# Patient Record
Sex: Male | Born: 1970 | Race: White | Hispanic: No | Marital: Married | Smoking: Never smoker
Health system: Southern US, Community
[De-identification: ages and names within clinical notes are randomized; demographics above are authoritative.]

## PROBLEM LIST (undated history)

## (undated) DIAGNOSIS — M1990 Unspecified osteoarthritis, unspecified site: Secondary | ICD-10-CM

## (undated) DIAGNOSIS — I10 Essential (primary) hypertension: Secondary | ICD-10-CM

## (undated) DIAGNOSIS — E785 Hyperlipidemia, unspecified: Secondary | ICD-10-CM

## (undated) DIAGNOSIS — R112 Nausea with vomiting, unspecified: Secondary | ICD-10-CM

## (undated) DIAGNOSIS — Z9889 Other specified postprocedural states: Secondary | ICD-10-CM

## (undated) HISTORY — DX: Hyperlipidemia, unspecified: E78.5

## (undated) HISTORY — DX: Unspecified osteoarthritis, unspecified site: M19.90

---

## 2004-11-28 HISTORY — PX: LYMPH NODE BIOPSY: SHX201

## 2017-12-11 DIAGNOSIS — Z0000 Encounter for general adult medical examination without abnormal findings: Secondary | ICD-10-CM | POA: Diagnosis not present

## 2017-12-11 DIAGNOSIS — Z6839 Body mass index (BMI) 39.0-39.9, adult: Secondary | ICD-10-CM | POA: Diagnosis not present

## 2017-12-11 DIAGNOSIS — Z13220 Encounter for screening for lipoid disorders: Secondary | ICD-10-CM | POA: Diagnosis not present

## 2018-01-16 DIAGNOSIS — L578 Other skin changes due to chronic exposure to nonionizing radiation: Secondary | ICD-10-CM | POA: Diagnosis not present

## 2018-01-16 DIAGNOSIS — L720 Epidermal cyst: Secondary | ICD-10-CM | POA: Diagnosis not present

## 2018-01-16 DIAGNOSIS — D485 Neoplasm of uncertain behavior of skin: Secondary | ICD-10-CM | POA: Diagnosis not present

## 2018-01-16 DIAGNOSIS — D1801 Hemangioma of skin and subcutaneous tissue: Secondary | ICD-10-CM | POA: Diagnosis not present

## 2018-03-05 DIAGNOSIS — I10 Essential (primary) hypertension: Secondary | ICD-10-CM | POA: Diagnosis not present

## 2018-03-05 DIAGNOSIS — E782 Mixed hyperlipidemia: Secondary | ICD-10-CM | POA: Diagnosis not present

## 2018-03-05 DIAGNOSIS — Z6834 Body mass index (BMI) 34.0-34.9, adult: Secondary | ICD-10-CM | POA: Diagnosis not present

## 2018-04-26 DIAGNOSIS — L720 Epidermal cyst: Secondary | ICD-10-CM | POA: Diagnosis not present

## 2018-12-05 DIAGNOSIS — Z6833 Body mass index (BMI) 33.0-33.9, adult: Secondary | ICD-10-CM | POA: Diagnosis not present

## 2018-12-05 DIAGNOSIS — Z79899 Other long term (current) drug therapy: Secondary | ICD-10-CM | POA: Diagnosis not present

## 2018-12-05 DIAGNOSIS — Z2821 Immunization not carried out because of patient refusal: Secondary | ICD-10-CM | POA: Diagnosis not present

## 2018-12-05 DIAGNOSIS — R899 Unspecified abnormal finding in specimens from other organs, systems and tissues: Secondary | ICD-10-CM | POA: Diagnosis not present

## 2018-12-05 DIAGNOSIS — E782 Mixed hyperlipidemia: Secondary | ICD-10-CM | POA: Diagnosis not present

## 2018-12-05 DIAGNOSIS — I10 Essential (primary) hypertension: Secondary | ICD-10-CM | POA: Diagnosis not present

## 2018-12-05 DIAGNOSIS — Z1331 Encounter for screening for depression: Secondary | ICD-10-CM | POA: Diagnosis not present

## 2019-08-06 DIAGNOSIS — E782 Mixed hyperlipidemia: Secondary | ICD-10-CM | POA: Diagnosis not present

## 2019-08-06 DIAGNOSIS — I10 Essential (primary) hypertension: Secondary | ICD-10-CM | POA: Diagnosis not present

## 2019-08-06 DIAGNOSIS — Z6834 Body mass index (BMI) 34.0-34.9, adult: Secondary | ICD-10-CM | POA: Diagnosis not present

## 2019-08-06 DIAGNOSIS — R899 Unspecified abnormal finding in specimens from other organs, systems and tissues: Secondary | ICD-10-CM | POA: Diagnosis not present

## 2019-08-06 DIAGNOSIS — Z79899 Other long term (current) drug therapy: Secondary | ICD-10-CM | POA: Diagnosis not present

## 2019-08-06 DIAGNOSIS — Z2821 Immunization not carried out because of patient refusal: Secondary | ICD-10-CM | POA: Diagnosis not present

## 2019-11-06 DIAGNOSIS — Z20828 Contact with and (suspected) exposure to other viral communicable diseases: Secondary | ICD-10-CM | POA: Diagnosis not present

## 2020-07-11 ENCOUNTER — Other Ambulatory Visit: Payer: Self-pay

## 2020-07-11 ENCOUNTER — Ambulatory Visit (INDEPENDENT_AMBULATORY_CARE_PROVIDER_SITE_OTHER): Payer: 59

## 2020-07-11 ENCOUNTER — Encounter (HOSPITAL_COMMUNITY): Payer: Self-pay

## 2020-07-11 ENCOUNTER — Ambulatory Visit (HOSPITAL_COMMUNITY)
Admission: EM | Admit: 2020-07-11 | Discharge: 2020-07-11 | Disposition: A | Discharge status: Home / Self Care | Payer: 59 | Attending: Family Medicine | Admitting: Family Medicine

## 2020-07-11 DIAGNOSIS — S52602A Unspecified fracture of lower end of left ulna, initial encounter for closed fracture: Secondary | ICD-10-CM

## 2020-07-11 DIAGNOSIS — S6992XA Unspecified injury of left wrist, hand and finger(s), initial encounter: Secondary | ICD-10-CM

## 2020-07-11 DIAGNOSIS — S52502A Unspecified fracture of the lower end of left radius, initial encounter for closed fracture: Secondary | ICD-10-CM

## 2020-07-11 DIAGNOSIS — M25532 Pain in left wrist: Secondary | ICD-10-CM | POA: Diagnosis not present

## 2020-07-11 HISTORY — DX: Essential (primary) hypertension: I10

## 2020-07-11 NOTE — Progress Notes (Signed)
Orthopedic Tech Progress Note Patient Details:  Robert Monroe 1970-12-25 [id number]  Ortho Devices Type of Ortho Device: Sugartong splint, Sling immobilizer Ortho Device/Splint Location: Left Upper Extremity Ortho Device/Splint Interventions: Ordered, Application, Adjustment   Post Interventions Patient Tolerated: Well Instructions Provided: Adjustment of device, Care of device, Poper ambulation with device   Tia Gelb P Harle Stanford 07/11/2020, 5:47 PM

## 2020-07-11 NOTE — Discharge Instructions (Addendum)
If not allergic, you may use over the counter ibuprofen or acetaminophen as needed. ° °
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
## 2020-07-15 ENCOUNTER — Other Ambulatory Visit: Payer: Self-pay

## 2020-07-15 ENCOUNTER — Encounter (HOSPITAL_BASED_OUTPATIENT_CLINIC_OR_DEPARTMENT_OTHER): Payer: Self-pay | Admitting: Orthopedic Surgery

## 2020-07-16 ENCOUNTER — Other Ambulatory Visit (HOSPITAL_COMMUNITY)
Admission: RE | Admit: 2020-07-16 | Discharge: 2020-07-16 | Disposition: A | Discharge status: Home / Self Care | Payer: 59 | Source: Ambulatory Visit | Attending: Orthopedic Surgery | Admitting: Orthopedic Surgery

## 2020-07-16 ENCOUNTER — Encounter (HOSPITAL_BASED_OUTPATIENT_CLINIC_OR_DEPARTMENT_OTHER)
Admission: RE | Admit: 2020-07-16 | Discharge: 2020-07-16 | Disposition: A | Discharge status: Home / Self Care | Payer: 59 | Source: Ambulatory Visit | Attending: Orthopedic Surgery | Admitting: Orthopedic Surgery

## 2020-07-16 DIAGNOSIS — Z01812 Encounter for preprocedural laboratory examination: Secondary | ICD-10-CM | POA: Insufficient documentation

## 2020-07-16 DIAGNOSIS — I10 Essential (primary) hypertension: Secondary | ICD-10-CM | POA: Diagnosis not present

## 2020-07-16 DIAGNOSIS — Z20822 Contact with and (suspected) exposure to covid-19: Secondary | ICD-10-CM | POA: Insufficient documentation

## 2020-07-16 DIAGNOSIS — S52572A Other intraarticular fracture of lower end of left radius, initial encounter for closed fracture: Secondary | ICD-10-CM | POA: Diagnosis not present

## 2020-07-16 DIAGNOSIS — S52612A Displaced fracture of left ulna styloid process, initial encounter for closed fracture: Secondary | ICD-10-CM | POA: Diagnosis not present

## 2020-07-16 LAB — SARS CORONAVIRUS 2 (TAT 6-24 HRS): SARS Coronavirus 2: NEGATIVE
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
## 2020-07-20 ENCOUNTER — Ambulatory Visit (HOSPITAL_BASED_OUTPATIENT_CLINIC_OR_DEPARTMENT_OTHER)
Admission: RE | Admit: 2020-07-20 | Discharge: 2020-07-20 | Disposition: A | Discharge status: Home / Self Care | Payer: 59 | Attending: Orthopedic Surgery | Admitting: Orthopedic Surgery

## 2020-07-20 ENCOUNTER — Encounter (HOSPITAL_BASED_OUTPATIENT_CLINIC_OR_DEPARTMENT_OTHER): Payer: Self-pay | Admitting: Orthopedic Surgery

## 2020-07-20 ENCOUNTER — Encounter (HOSPITAL_BASED_OUTPATIENT_CLINIC_OR_DEPARTMENT_OTHER)
Admission: RE | Disposition: A | Discharge status: Home / Self Care | Payer: Self-pay | Source: Home / Self Care | Attending: Orthopedic Surgery

## 2020-07-20 ENCOUNTER — Ambulatory Visit (HOSPITAL_BASED_OUTPATIENT_CLINIC_OR_DEPARTMENT_OTHER): Payer: 59 | Admitting: Anesthesiology

## 2020-07-20 ENCOUNTER — Other Ambulatory Visit: Payer: Self-pay

## 2020-07-20 DIAGNOSIS — I10 Essential (primary) hypertension: Secondary | ICD-10-CM | POA: Insufficient documentation

## 2020-07-20 DIAGNOSIS — S52502A Unspecified fracture of the lower end of left radius, initial encounter for closed fracture: Secondary | ICD-10-CM

## 2020-07-20 DIAGNOSIS — S52612A Displaced fracture of left ulna styloid process, initial encounter for closed fracture: Secondary | ICD-10-CM | POA: Insufficient documentation

## 2020-07-20 DIAGNOSIS — S52572A Other intraarticular fracture of lower end of left radius, initial encounter for closed fracture: Secondary | ICD-10-CM | POA: Diagnosis not present

## 2020-07-20 HISTORY — DX: Nausea with vomiting, unspecified: R11.2

## 2020-07-20 HISTORY — PX: ORIF WRIST FRACTURE: SHX2133

## 2020-07-20 HISTORY — DX: Other specified postprocedural states: Z98.890

## 2020-07-20 SURGERY — OPEN REDUCTION INTERNAL FIXATION (ORIF) WRIST FRACTURE
Anesthesia: Regional | Site: Wrist | Laterality: Left

## 2020-07-20 MED ORDER — CEFAZOLIN SODIUM-DEXTROSE 2-4 GM/100ML-% IV SOLN
2.0000 g | INTRAVENOUS | Status: AC
  Administered 2020-07-20: 2 g via INTRAVENOUS

## 2020-07-20 MED ORDER — DEXAMETHASONE SODIUM PHOSPHATE 10 MG/ML IJ SOLN
INTRAMUSCULAR | Status: DC | PRN
  Administered 2020-07-20: 8 mg via INTRAVENOUS

## 2020-07-20 MED ORDER — LACTATED RINGERS IV SOLN: INTRAVENOUS | Status: DC

## 2020-07-20 MED ORDER — PROPOFOL 500 MG/50ML IV EMUL
INTRAVENOUS | Status: DC | PRN
  Administered 2020-07-20: 75 ug/kg/min via INTRAVENOUS

## 2020-07-20 MED ORDER — KETOROLAC TROMETHAMINE 30 MG/ML IJ SOLN
30.0000 mg | Freq: Once | INTRAMUSCULAR | Status: AC | PRN
  Administered 2020-07-20: 30 mg via INTRAVENOUS

## 2020-07-20 MED ORDER — FENTANYL CITRATE (PF) 100 MCG/2ML IJ SOLN
INTRAMUSCULAR | Status: AC
  Filled 2020-07-20: qty 2

## 2020-07-20 MED ORDER — MIDAZOLAM HCL 2 MG/2ML IJ SOLN
2.0000 mg | Freq: Once | INTRAMUSCULAR | Status: AC
  Administered 2020-07-20: 2 mg via INTRAVENOUS

## 2020-07-20 MED ORDER — PROPOFOL 500 MG/50ML IV EMUL
INTRAVENOUS | Status: AC
  Filled 2020-07-20: qty 50

## 2020-07-20 MED ORDER — KETOROLAC TROMETHAMINE 30 MG/ML IJ SOLN
INTRAMUSCULAR | Status: AC
  Filled 2020-07-20: qty 1

## 2020-07-20 MED ORDER — ONDANSETRON HCL 4 MG/2ML IJ SOLN
INTRAMUSCULAR | Status: DC | PRN
  Administered 2020-07-20: 4 mg via INTRAVENOUS

## 2020-07-20 MED ORDER — OXYCODONE HCL 5 MG/5ML PO SOLN: 5.0000 mg | Freq: Once | ORAL | Status: DC | PRN

## 2020-07-20 MED ORDER — ACETAMINOPHEN 500 MG PO TABS
ORAL_TABLET | ORAL | Status: AC
  Filled 2020-07-20: qty 2

## 2020-07-20 MED ORDER — DEXAMETHASONE SODIUM PHOSPHATE 10 MG/ML IJ SOLN
INTRAMUSCULAR | Status: AC
  Filled 2020-07-20: qty 1

## 2020-07-20 MED ORDER — CEFAZOLIN SODIUM-DEXTROSE 2-4 GM/100ML-% IV SOLN
INTRAVENOUS | Status: AC
  Filled 2020-07-20: qty 100

## 2020-07-20 MED ORDER — OXYCODONE HCL 5 MG PO TABS: 5.0000 mg | ORAL_TABLET | Freq: Once | ORAL | Status: DC | PRN

## 2020-07-20 MED ORDER — ONDANSETRON HCL 4 MG/2ML IJ SOLN
INTRAMUSCULAR | Status: AC
  Filled 2020-07-20: qty 2

## 2020-07-20 MED ORDER — PROMETHAZINE HCL 25 MG/ML IJ SOLN: 6.2500 mg | INTRAMUSCULAR | Status: DC | PRN

## 2020-07-20 MED ORDER — ACETAMINOPHEN 500 MG PO TABS
1000.0000 mg | ORAL_TABLET | Freq: Once | ORAL | Status: AC
  Administered 2020-07-20: 1000 mg via ORAL

## 2020-07-20 MED ORDER — HYDROMORPHONE HCL 1 MG/ML IJ SOLN
0.2500 mg | INTRAMUSCULAR | Status: DC | PRN
  Administered 2020-07-20: 0.5 mg via INTRAVENOUS

## 2020-07-20 MED ORDER — MIDAZOLAM HCL 2 MG/2ML IJ SOLN
INTRAMUSCULAR | Status: AC
  Filled 2020-07-20: qty 2

## 2020-07-20 MED ORDER — HYDROMORPHONE HCL 1 MG/ML IJ SOLN
INTRAMUSCULAR | Status: AC
  Filled 2020-07-20: qty 0.5

## 2020-07-20 MED ORDER — ROPIVACAINE HCL 5 MG/ML IJ SOLN
INTRAMUSCULAR | Status: DC | PRN
  Administered 2020-07-20: 30 mL via PERINEURAL

## 2020-07-20 MED ORDER — PROPOFOL 10 MG/ML IV BOLUS
INTRAVENOUS | Status: DC | PRN
  Administered 2020-07-20: 20 mg via INTRAVENOUS
  Administered 2020-07-20: 200 mg via INTRAVENOUS

## 2020-07-20 MED ORDER — FENTANYL CITRATE (PF) 100 MCG/2ML IJ SOLN
INTRAMUSCULAR | Status: DC | PRN
  Administered 2020-07-20 (×2): 50 ug via INTRAVENOUS

## 2020-07-20 SURGICAL SUPPLY — 79 items
BENZOIN TINCTURE PRP APPL 2/3 (GAUZE/BANDAGES/DRESSINGS) ×2 IMPLANT
BIT DRILL 2.2 SS TIBIAL (BIT) ×2 IMPLANT
BLADE SURG 15 STRL LF DISP TIS (BLADE) ×1 IMPLANT
BLADE SURG 15 STRL SS (BLADE) ×1
BNDG COHESIVE 4X5 TAN STRL (GAUZE/BANDAGES/DRESSINGS) ×2 IMPLANT
BNDG CONFORM 3 STRL LF (GAUZE/BANDAGES/DRESSINGS) IMPLANT
BNDG ELASTIC 4X5.8 VLCR STR LF (GAUZE/BANDAGES/DRESSINGS) ×4 IMPLANT
BNDG ESMARK 4X9 LF (GAUZE/BANDAGES/DRESSINGS) ×2 IMPLANT
BNDG GAUZE ELAST 4 BULKY (GAUZE/BANDAGES/DRESSINGS) ×2 IMPLANT
CANISTER SUCT 1200ML W/VALVE (MISCELLANEOUS) ×2 IMPLANT
CORD BIPOLAR FORCEPS 12FT (ELECTRODE) IMPLANT
COVER BACK TABLE 60X90IN (DRAPES) ×2 IMPLANT
COVER WAND RF STERILE (DRAPES) IMPLANT
CUFF TOURN SGL QUICK 18X4 (TOURNIQUET CUFF) ×2 IMPLANT
DECANTER SPIKE VIAL GLASS SM (MISCELLANEOUS) IMPLANT
DRAPE EXTREMITY T 121X128X90 (DISPOSABLE) ×2 IMPLANT
DRAPE OEC MINIVIEW 54X84 (DRAPES) ×2 IMPLANT
ELECT NEEDLE TIP 2.8 STRL (NEEDLE) IMPLANT
ELECT REM PT RETURN 9FT ADLT (ELECTROSURGICAL)
ELECTRODE REM PT RTRN 9FT ADLT (ELECTROSURGICAL) IMPLANT
GAUZE SPONGE 4X4 12PLY STRL (GAUZE/BANDAGES/DRESSINGS) ×2 IMPLANT
GLOVE BIO SURGEON STRL SZ 6.5 (GLOVE) ×2 IMPLANT
GLOVE BIOGEL PI IND STRL 6.5 (GLOVE) ×2 IMPLANT
GLOVE BIOGEL PI IND STRL 8.5 (GLOVE) ×1 IMPLANT
GLOVE BIOGEL PI INDICATOR 6.5 (GLOVE) ×2
GLOVE BIOGEL PI INDICATOR 8.5 (GLOVE) ×1
GLOVE ECLIPSE 6.5 STRL STRAW (GLOVE) ×2 IMPLANT
GLOVE SURG ORTHO 8.0 STRL STRW (GLOVE) ×2 IMPLANT
GOWN STRL REUS W/ TWL XL LVL3 (GOWN DISPOSABLE) ×1 IMPLANT
GOWN STRL REUS W/TWL XL LVL3 (GOWN DISPOSABLE) ×1
K-WIRE 1.6 (WIRE) ×1
K-WIRE FX5X1.6XNS BN SS (WIRE) ×1
KWIRE FX5X1.6XNS BN SS (WIRE) ×1 IMPLANT
NEEDLE HYPO 25X1 1.5 SAFETY (NEEDLE) ×2 IMPLANT
NS IRRIG 1000ML POUR BTL (IV SOLUTION) ×2 IMPLANT
PACK BASIN DAY SURGERY FS (CUSTOM PROCEDURE TRAY) ×2 IMPLANT
PAD CAST 4YDX4 CTTN HI CHSV (CAST SUPPLIES) ×2 IMPLANT
PADDING CAST ABS 4INX4YD NS (CAST SUPPLIES) ×1
PADDING CAST ABS COTTON 4X4 ST (CAST SUPPLIES) ×1 IMPLANT
PADDING CAST COTTON 4X4 STRL (CAST SUPPLIES) ×2
PEG LOCKING SMOOTH 2.2X18 (Peg) ×2 IMPLANT
PEG LOCKING SMOOTH 2.2X20 (Screw) ×8 IMPLANT
PEG LOCKING SMOOTH 2.2X22 (Screw) ×2 IMPLANT
PEG LOCKING SMOOTH 2.2X24 (Peg) ×8 IMPLANT
PEG LOCKING SMOOTH 2.2X26 (Peg) ×2 IMPLANT
PENCIL SMOKE EVACUATOR (MISCELLANEOUS) IMPLANT
PLATE WIDE DVR LEFT (Plate) ×2 IMPLANT
SCREW LOCK 16X2.7X 3 LD TPR (Screw) ×3 IMPLANT
SCREW LOCK 18X2.7X 3 LD TPR (Screw) ×2 IMPLANT
SCREW LOCKING 2.7X16 (Screw) ×3 IMPLANT
SCREW LOCKING 2.7X18 (Screw) ×2 IMPLANT
SCREW NONLOCK 2.7X28MM (Screw) ×2 IMPLANT
SLEEVE SCD COMPRESS KNEE MED (MISCELLANEOUS) ×2 IMPLANT
SLING ARM FOAM STRAP LRG (SOFTGOODS) IMPLANT
SPLINT FIBERGLASS 3X35 (CAST SUPPLIES) ×2 IMPLANT
SPLINT FIBERGLASS 4X30 (CAST SUPPLIES) IMPLANT
STOCKINETTE 4X48 STRL (DRAPES) ×2 IMPLANT
STRIP CLOSURE SKIN 1/2X4 (GAUZE/BANDAGES/DRESSINGS) ×2 IMPLANT
SUCTION FRAZIER HANDLE 10FR (MISCELLANEOUS) ×1
SUCTION TUBE FRAZIER 10FR DISP (MISCELLANEOUS) ×1 IMPLANT
SUT FIBERWIRE 3-0 18 TAPR NDL (SUTURE)
SUT MNCRL AB 3-0 PS2 18 (SUTURE) IMPLANT
SUT MON AB 3-0 SH 27 (SUTURE)
SUT MON AB 3-0 SH27 (SUTURE) IMPLANT
SUT PROLENE 3 0 PS 1 (SUTURE) IMPLANT
SUT PROLENE 4 0 PS 2 18 (SUTURE) IMPLANT
SUT VIC AB 0 CT1 27 (SUTURE)
SUT VIC AB 0 CT1 27XBRD ANBCTR (SUTURE) IMPLANT
SUT VIC AB 2-0 PS2 27 (SUTURE) IMPLANT
SUT VIC AB 2-0 SH 27 (SUTURE)
SUT VIC AB 2-0 SH 27XBRD (SUTURE) IMPLANT
SUT VICRYL 4-0 PS2 18IN ABS (SUTURE) IMPLANT
SUTURE FIBERWR 3-0 18 TAPR NDL (SUTURE) IMPLANT
SYR BULB EAR ULCER 3OZ GRN STR (SYRINGE) ×2 IMPLANT
SYR CONTROL 10ML LL (SYRINGE) ×2 IMPLANT
TOWEL GREEN STERILE FF (TOWEL DISPOSABLE) ×2 IMPLANT
TRAY DSU PREP LF (CUSTOM PROCEDURE TRAY) ×2 IMPLANT
TUBE CONNECTING 20X1/4 (TUBING) ×2 IMPLANT
UNDERPAD 30X36 HEAVY ABSORB (UNDERPADS AND DIAPERS) ×2 IMPLANT
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
## 2020-07-20 NOTE — Anesthesia Procedure Notes (Signed)
Procedure Name: LMA Insertion Date/Time: 07/20/2020 12:30 PM Performed by: Thornell Mule, CRNA Pre-anesthesia Checklist: Patient identified, Emergency Drugs available, Suction available and Patient being monitored Patient Re-evaluated:Patient Re-evaluated prior to induction Oxygen Delivery Method: Circle system utilized Preoxygenation: Pre-oxygenation with 100% oxygen Induction Type: IV induction LMA: LMA inserted LMA Size: 5.0 Number of attempts: 1 Placement Confirmation: positive ETCO2 Tube secured with: Tape Dental Injury: Teeth and Oropharynx as per pre-operative assessment

## 2020-07-20 NOTE — Transfer of Care (Signed)
Immediate Anesthesia Transfer of Care Note  Patient: Robert Monroe  Procedure(s) Performed: OPEN REDUCTION INTERNAL FIXATION (ORIF) WRIST FRACTURE (Left Wrist)  Patient Location: PACU  Anesthesia Type:General and Regional  Level of Consciousness: drowsy, patient cooperative and responds to stimulation  Airway & Oxygen Therapy: Patient Spontanous Breathing and Patient connected to face mask oxygen  Post-op Assessment: Report given to RN and Post -op Vital signs reviewed and stable  Post vital signs: Reviewed and stable  Last Vitals:  Vitals Value Taken Time  BP    Temp    Pulse    Resp    SpO2      Last Pain:  Vitals:   07/20/20 1057  TempSrc: Oral  PainSc: 0-No pain         Complications: No complications documented.

## 2020-07-20 NOTE — Discharge Instructions (Signed)
KEEP BANDAGE CLEAN AND DRY CALL OFFICE FOR F/U APPT [phone] SENT TO [facility] ON [address], PERCOCET, ROBAXIN, COLACE KEEP HAND ELEVATED ABOVE HEART OK TO APPLY ICE TO OPERATIVE AREA CONTACT OFFICE IF ANY WORSENING PAIN OR CONCERNS.  May have Tylenol at 5p.m. May have Ibuprofen at 8 p.m.   Post Anesthesia Home Care Instructions  Activity: Get plenty of rest for the remainder of the day. A responsible individual must stay with you for 24 hours following the procedure.  For the next 24 hours, DO NOT: -Drive a car -Advertising copywriter -Drink alcoholic beverages -Take any medication unless instructed by your physician -Make any legal decisions or sign important papers.  Meals: Start with liquid foods such as gelatin or soup. Progress to regular foods as tolerated. Avoid greasy, spicy, heavy foods. If nausea and/or vomiting occur, drink only clear liquids until the nausea and/or vomiting subsides. Call your physician if vomiting continues.  Special Instructions/Symptoms: Your throat may feel dry or sore from the anesthesia or the breathing tube placed in your throat during surgery. If this causes discomfort, gargle with warm salt water. The discomfort should disappear within 24 hours.  If you had a scopolamine patch placed behind your ear for the management of post- operative nausea and/or vomiting:  1. The medication in the patch is effective for 72 hours, after which it should be removed.  Wrap patch in a tissue and discard in the trash. Wash hands thoroughly with soap and water. 2. You may remove the patch earlier than 72 hours if you experience unpleasant side effects which may include dry mouth, dizziness or visual disturbances. 3. Avoid touching the patch. Wash your hands with soap and water after contact with the patch.    Regional Anesthesia Blocks  1. Numbness or the inability to move the "blocked" extremity may last from 3-48 hours after placement. The length of time  depends on the medication injected and your individual response to the medication. If the numbness is not going away after 48 hours, call your surgeon.  2. The extremity that is blocked will need to be protected until the numbness is gone and the  Strength has returned. Because you cannot feel it, you will need to take extra care to avoid injury. Because it may be weak, you may have difficulty moving it or using it. You may not know what position it is in without looking at it while the block is in effect.  3. For blocks in the legs and feet, returning to weight bearing and walking needs to be done carefully. You will need to wait until the numbness is entirely gone and the strength has returned. You should be able to move your leg and foot normally before you try and bear weight or walk. You will need someone to be with you when you first try to ensure you do not fall and possibly risk injury.  4. Bruising and tenderness at the needle site are common side effects and will resolve in a few days.  5. Persistent numbness or new problems with movement should be communicated to the surgeon or the [facility] [phone] [facility] [phone]).

## 2020-07-20 NOTE — Anesthesia Preprocedure Evaluation (Addendum)
Anesthesia Evaluation  Patient identified by MRN, date of birth, ID band Patient awake    Reviewed: Allergy & Precautions, Patient's Chart, lab work & pertinent test results  History of Anesthesia Complications (+) PONV and history of anesthetic complications  Airway Mallampati: I  TM Distance: >3 FB Neck ROM: Full    Dental  (+) Missing   Pulmonary neg pulmonary ROS,    Pulmonary exam normal breath sounds clear to auscultation       Cardiovascular hypertension, Pt. on home beta blockers Normal cardiovascular exam Rhythm:Regular Rate:Normal  ECG: SB, rate 57   Neuro/Psych negative neurological ROS  negative psych ROS   GI/Hepatic negative GI ROS, Neg liver ROS,   Endo/Other  negative endocrine ROS  Renal/GU negative Renal ROS     Musculoskeletal negative musculoskeletal ROS (+)   Abdominal   Peds  Hematology  (+) REFUSES BLOOD PRODUCTS, HLD   Anesthesia Other Findings Left wrist intra articular distal radius fracture displaced  Reproductive/Obstetrics                            Anesthesia Physical Anesthesia Plan  ASA: II  Anesthesia Plan: Regional   Post-op Pain Management:    Induction: Intravenous  PONV Risk Score and Plan: 2 and Ondansetron, Dexamethasone, Propofol infusion, Midazolam and Treatment may vary due to age or medical condition  Airway Management Planned: Simple Face Mask  Additional Equipment:   Intra-op Plan:   Post-operative Plan:   Informed Consent: I have reviewed the patients History and Physical, chart, labs and discussed the procedure including the risks, benefits and alternatives for the proposed anesthesia with the patient or authorized representative who has indicated his/her understanding and acceptance.     Dental advisory given  Plan Discussed with: CRNA  Anesthesia Plan Comments:        Anesthesia Quick Evaluation

## 2020-07-20 NOTE — Anesthesia Procedure Notes (Signed)
Anesthesia Regional Block: Supraclavicular block   Pre-Anesthetic Checklist: ,, timeout performed, Correct Patient, Correct Site, Correct Laterality, Correct Procedure, Correct Position, site marked, Risks and benefits discussed,  Surgical consent,  Pre-op evaluation,  At surgeon's request and post-op pain management  Laterality: Left  Prep: chloraprep       Needles:  Injection technique: Single-shot  Needle Type: Echogenic Stimulator Needle     Needle Length: 10cm  Needle Gauge: 20     Additional Needles:   Procedures:,,,, ultrasound used (permanent image in chart),,,,  Narrative:  Start time: 07/20/2020 11:40 AM End time: 07/20/2020 11:50 AM Injection made incrementally with aspirations every 5 mL.  Performed by: Personally  Anesthesiologist: Leonides Grills, MD  Additional Notes: Functioning IV was confirmed and monitors were applied.  A timeout was performed. Sterile prep, hand hygiene and sterile gloves were used. A 20ga BBraun echogenic stimulator needle was used. Negative aspiration and negative test dose prior to incremental administration of local anesthetic. The patient tolerated the procedure well.  Ultrasound guidance: relevent anatomy identified, needle position confirmed, local anesthetic spread visualized around nerve(s), vascular puncture avoided.  Image printed for medical record.

## 2020-07-20 NOTE — Anesthesia Postprocedure Evaluation (Signed)
Anesthesia Post Note  Patient: Robert Monroe  Procedure(s) Performed: OPEN REDUCTION INTERNAL FIXATION (ORIF) WRIST FRACTURE (Left Wrist)     Patient location during evaluation: PACU Anesthesia Type: Regional and General Level of consciousness: awake and alert Pain management: pain level controlled Vital Signs Assessment: post-procedure vital signs reviewed and stable Respiratory status: spontaneous breathing, nonlabored ventilation, respiratory function stable and patient connected to nasal cannula oxygen Cardiovascular status: blood pressure returned to baseline and stable Postop Assessment: no apparent nausea or vomiting Anesthetic complications: no   No complications documented.  Last Vitals:  Vitals:   07/20/20 1415 07/20/20 1500  BP: (!) 141/89 (!) 158/90  Pulse: 65 62  Resp: 15 16  Temp:  36.6 C  SpO2: 94% 97%    Last Pain:  Vitals:   07/20/20 1500  TempSrc:   PainSc: 2                  Robert Monroe Robert Monroe

## 2020-07-20 NOTE — Progress Notes (Signed)
Assisted Dr. Ellender with left, ultrasound guided, supraclavicular block. Side rails up, monitors on throughout procedure. See vital signs in flow sheet. Tolerated Procedure well. 

## 2020-07-21 ENCOUNTER — Encounter (HOSPITAL_BASED_OUTPATIENT_CLINIC_OR_DEPARTMENT_OTHER): Payer: Self-pay | Admitting: Orthopedic Surgery

## 2021-06-17 IMAGING — DX DG WRIST COMPLETE 3+V*L*
4 series · 4 of 4 positions shown · non-contrast
Comparison: None.

CLINICAL DATA: Left wrist injury after a fall

EXAM:
LEFT WRIST - COMPLETE 3+ VIEW

[wrist pa]
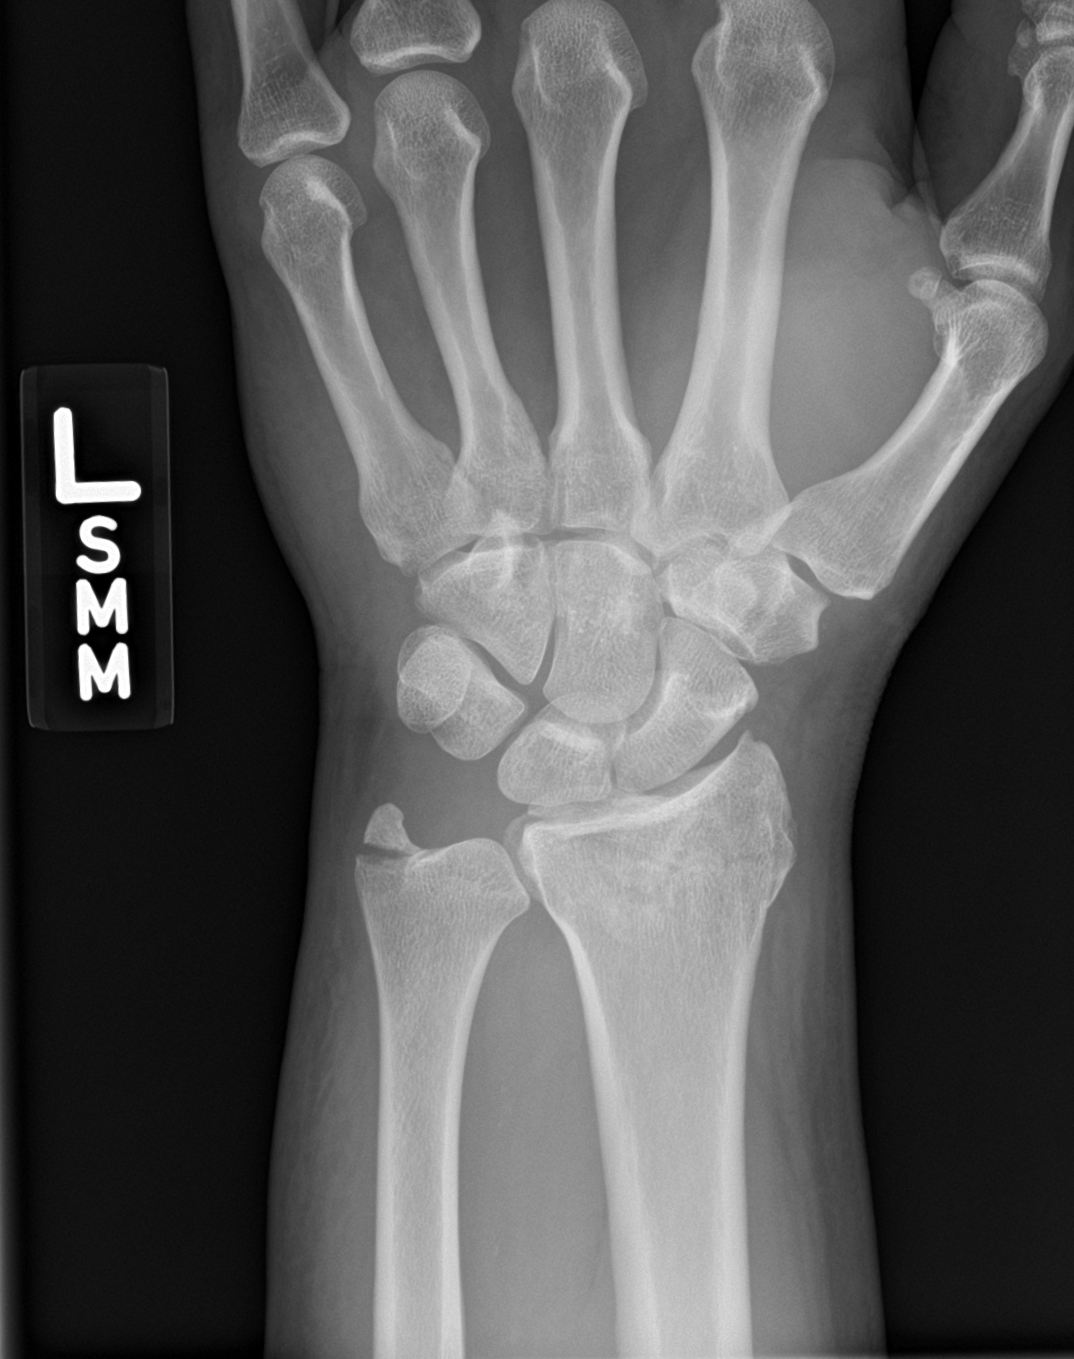

[wrist navicular]
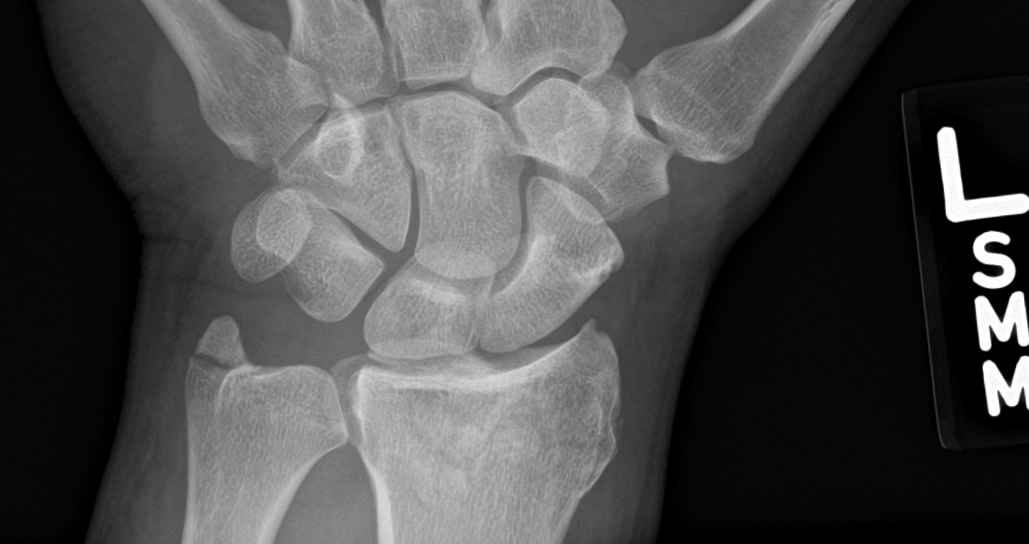

[wrist obl]
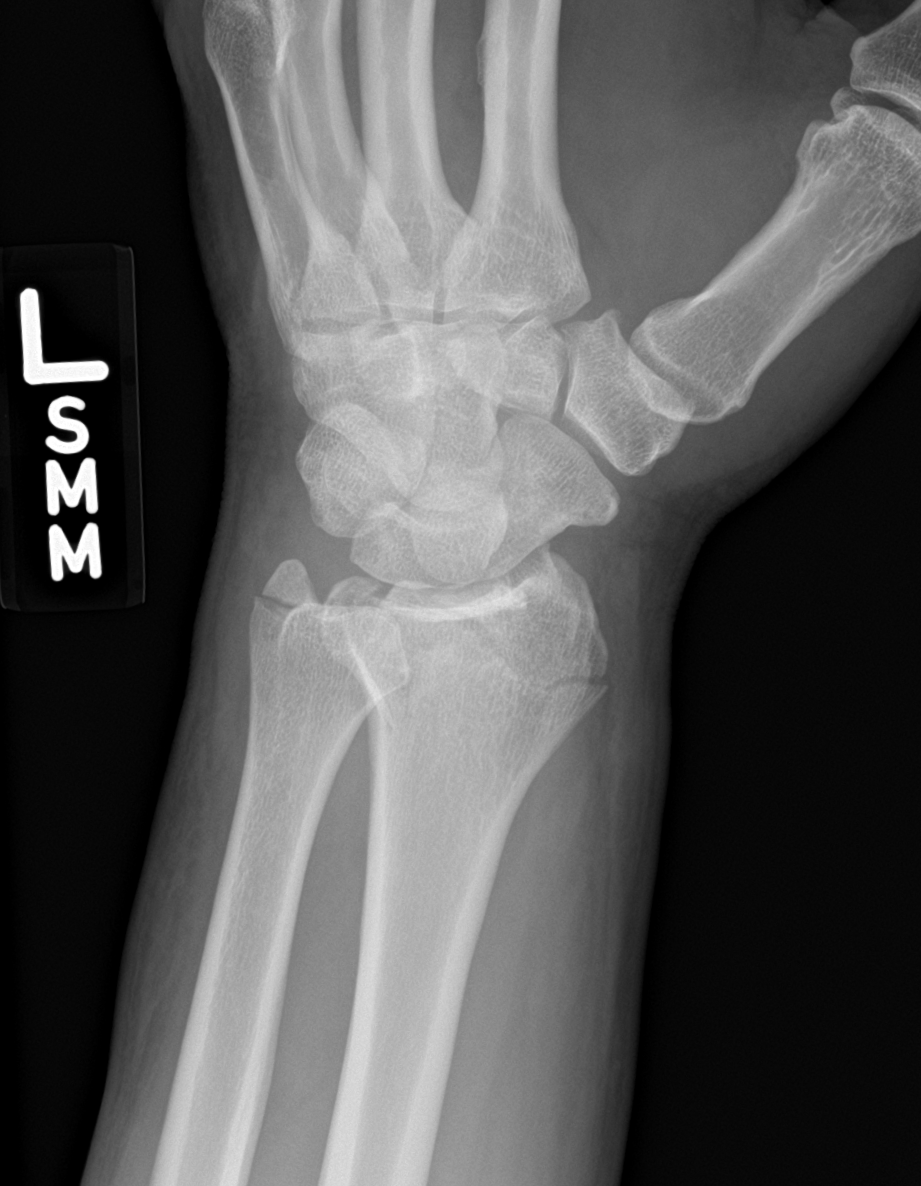

[wrist lat]
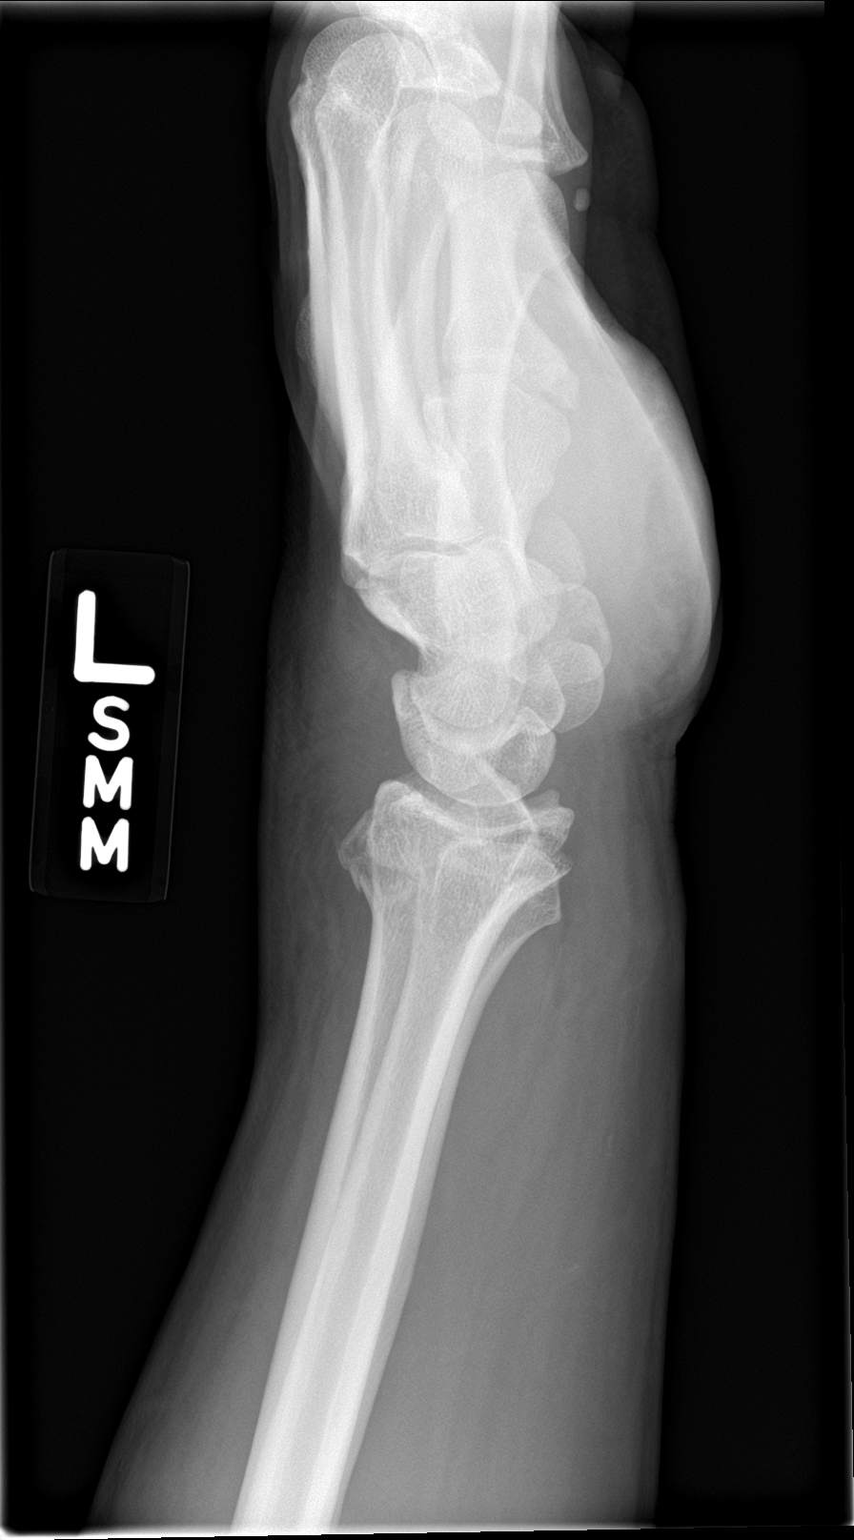

[4 of 4 positions shown; findings below may reference images not displayed]

FINDINGS: Acute, nondisplaced ulnar styloid fracture.

Acute, transverse distal radius fracture that likely reaches the
wrist joint medially (see oblique view). Borderline posterior
impaction of the radius fracture.

Regional soft tissue swelling.
IMPRESSION: Distal radius and ulnar fractures as described.

## 2023-06-29 ENCOUNTER — Encounter: Payer: Self-pay | Admitting: Internal Medicine

## 2023-07-20 ENCOUNTER — Ambulatory Visit (AMBULATORY_SURGERY_CENTER): Payer: Self-pay | Admitting: *Deleted

## 2023-07-20 VITALS — Ht 74.0 in | Wt 235.0 lb

## 2023-07-20 DIAGNOSIS — Z1211 Encounter for screening for malignant neoplasm of colon: Secondary | ICD-10-CM

## 2023-07-20 MED ORDER — NA SULFATE-K SULFATE-MG SULF 17.5-3.13-1.6 GM/177ML PO SOLN: 1.0000 | Freq: Once | ORAL | 0 refills | Status: AC

## 2023-07-20 NOTE — Progress Notes (Signed)
 Pt's name and DOB verified at the beginning of the pre-visit.  Pt denies any difficulty with ambulating,sitting, laying down or rolling side to side Gave both [facility] main # and MD on call # prior to instructions.  No egg or soy allergy known to patient  No issues known to pt with past sedation with any surgeries or procedures Pt denies having issues being intubated Pt has no issues moving head neck or swallowing No FH of Malignant Hyperthermia Pt is not on diet pills Pt is not on home 02  Pt is not on blood thinners  Pt denies issues with constipation  Pt is not on dialysis Pt denise any abnormal heart rhythms  Pt denies any upcoming cardiac testing Pt encouraged to use to use Singlecare or Goodrx to reduce cost  Patient's chart reviewed by Cathlyn Parsons CNRA prior to pre-visit and patient appropriate for the [facility].  Pre-visit completed and red dot placed by patient's name on their procedure day (on provider's schedule).  . Visit by phone Pt states weight is 235 lb Instructed pt why it is important to and  to call if they have any changes in health or new medications. Directed them to the # given and on instructions.   Pt states they will.  Instructions reviewed with pt and pt states understanding. Instructed to review again prior to procedure. Pt states they will.  Instructions sent by mail with coupon and by my chart

## 2023-08-12 ENCOUNTER — Encounter: Payer: Self-pay | Admitting: Certified Registered Nurse Anesthetist

## 2023-08-17 ENCOUNTER — Encounter: Payer: BLUE CROSS/BLUE SHIELD | Admitting: Internal Medicine

## 2024-02-28 ENCOUNTER — Encounter: Payer: Self-pay | Admitting: Internal Medicine

## 2024-04-02 ENCOUNTER — Ambulatory Visit (AMBULATORY_SURGERY_CENTER): Admitting: *Deleted

## 2024-04-02 ENCOUNTER — Telehealth: Payer: Self-pay | Admitting: *Deleted

## 2024-04-02 VITALS — Ht 75.0 in | Wt 243.0 lb

## 2024-04-02 DIAGNOSIS — Z1211 Encounter for screening for malignant neoplasm of colon: Secondary | ICD-10-CM

## 2024-04-02 NOTE — Telephone Encounter (Signed)
Patient reached and PV completed.

## 2024-04-02 NOTE — Progress Notes (Signed)
 PV conducted over telephone. Instructions forwarded through MyChart and secure email.  Patient has suprep from previously cancelled colonoscopy.  Patient has a new rx for [facility].  Instructed to not start or take within 7 days of colonoscopy.    No egg or soy allergy known to patient  No issues known to pt with past sedation with any surgeries or procedures Patient denies ever being told they had issues or difficulty with intubation  No FH of Malignant Hyperthermia Pt is not on diet pills Pt is not on  home 02  Pt is not on blood thinners  Pt denies issues with constipation  No A fib or A flutter Have any cardiac testing pending--NO Pt instructed to use [email or web address] or GoodRx for a price reduction on prep

## 2024-04-05 ENCOUNTER — Encounter: Payer: Self-pay | Admitting: Internal Medicine

## 2024-04-23 ENCOUNTER — Encounter: Payer: Self-pay | Admitting: Internal Medicine

## 2024-04-23 ENCOUNTER — Ambulatory Visit: Admitting: Internal Medicine

## 2024-04-23 VITALS — BP 106/73 | HR 71 | Temp 98.2°F | Resp 10 | Ht 76.0 in | Wt 243.0 lb

## 2024-04-23 DIAGNOSIS — K648 Other hemorrhoids: Secondary | ICD-10-CM | POA: Diagnosis not present

## 2024-04-23 DIAGNOSIS — K635 Polyp of colon: Secondary | ICD-10-CM

## 2024-04-23 DIAGNOSIS — D120 Benign neoplasm of cecum: Secondary | ICD-10-CM

## 2024-04-23 DIAGNOSIS — Z1211 Encounter for screening for malignant neoplasm of colon: Secondary | ICD-10-CM | POA: Diagnosis present

## 2024-04-23 MED ORDER — SODIUM CHLORIDE 0.9 % IV SOLN: 500.0000 mL | INTRAVENOUS | Status: DC

## 2024-04-23 NOTE — Op Note (Signed)
 [facility] Patient Name: Robert Monroe Procedure Date: 04/23/2024 11:07 AM MRN: [id number] Endoscopist: Freada Jacobs [place] , , [id number] Age: 53 Referring MD:  Date of Birth: December 10, 1970 Gender: Male Account #: [id number] Procedure:                Colonoscopy Indications:              Screening for colorectal malignant neoplasm, This                            is the patient's first colonoscopy Medicines:                Monitored Anesthesia Care Procedure:                Pre-Anesthesia Assessment:                           - Prior to the procedure, a History and Physical                            was performed, and patient medications and                            allergies were reviewed. The patient's tolerance of                            previous anesthesia was also reviewed. The risks                            and benefits of the procedure and the sedation                            options and risks were discussed with the patient.                            All questions were answered, and informed consent                            was obtained. Prior Anticoagulants: The patient has                            taken no anticoagulant or antiplatelet agents. ASA                            Grade Assessment: II - A patient with mild systemic                            disease. After reviewing the risks and benefits,                            the patient was deemed in satisfactory condition to                            undergo the procedure.  After obtaining informed consent, the colonoscope                            was passed under direct vision. Throughout the                            procedure, the patient's blood pressure, pulse, and                            oxygen saturations were monitored continuously. The                            Olympus Scope [id number] was introduced through the                            anus and  advanced to the the terminal ileum. The                            colonoscopy was performed without difficulty. The                            patient tolerated the procedure well. The quality                            of the bowel preparation was excellent. The                            terminal ileum, ileocecal valve, appendiceal                            orifice, and rectum were photographed. Scope In: 11:20:07 AM Scope Out: 11:40:46 AM Scope Withdrawal Time: 0 hours 16 minutes 33 seconds  Total Procedure Duration: 0 hours 20 minutes 39 seconds  Findings:                 The terminal ileum appeared normal.                           A 3 mm polyp was found in the cecum. The polyp was                            sessile. The polyp was removed with a cold snare.                            Resection and retrieval were complete.                           Non-bleeding internal hemorrhoids were found during                            retroflexion. Complications:            No immediate complications. Estimated Blood Loss:     Estimated blood loss was minimal. Impression:               -  The examined portion of the ileum was normal.                           - One 3 mm polyp in the cecum, removed with a cold                            snare. Resected and retrieved.                           - Non-bleeding internal hemorrhoids. Recommendation:           - Discharge patient to home (with escort).                           - Await pathology results.                           - The findings and recommendations were discussed                            with the patient. Dr Pedro Bourgeois "[name]" [name],  04/23/2024 11:42:39 AM

## 2024-04-23 NOTE — Progress Notes (Signed)
 Called to room to assist during endoscopic procedure.  Patient ID and intended procedure confirmed with present staff. Received instructions for my participation in the procedure from the performing physician.

## 2024-04-23 NOTE — Progress Notes (Signed)
 To pacu, VSS. Report to [email or web address].tb

## 2024-04-23 NOTE — Progress Notes (Signed)
 Pt's states no medical or surgical changes since previsit or office visit.

## 2024-04-23 NOTE — Progress Notes (Signed)
 GASTROENTEROLOGY PROCEDURE H&P NOTE   Primary Care Physician: Patient, No Pcp Per    Reason for Procedure:   Colon cancer screening  Plan:    Colonoscopy  Patient is appropriate for endoscopic procedure(s) in the ambulatory ([facility]) setting.  The nature of the procedure, as well as the risks, benefits, and alternatives were carefully and thoroughly reviewed with the patient. Ample time for discussion and questions allowed. The patient understood, was satisfied, and agreed to proceed.     HPI: Robert Monroe is a 53 y.o. male who presents for colonoscopy for colon cancer screening. Denies blood in stools, changes in bowel habits, or unintentional weight loss. Denies family history of colon cancer. This is his first colonoscopy.  Past Medical History:  Diagnosis Date   Arthritis    Hyperlipidemia    Hypertension    PONV (postoperative nausea and vomiting)     Past Surgical History:  Procedure Laterality Date   LYMPH NODE BIOPSY Right 2006   ORIF WRIST FRACTURE Left 07/20/2020   Procedure: OPEN REDUCTION INTERNAL FIXATION (ORIF) WRIST FRACTURE;  Surgeon: Arvil Birks, MD;  Location: [facility];  Service: Orthopedics;  Laterality: Left;  IV sedation block in preop    Prior to Admission medications   Medication Sig Start Date End Date Taking? Authorizing Provider  atenolol (TENORMIN) 100 MG tablet Take 100 mg by mouth daily. 05/15/20  Yes [provider]  cetirizine (KLS ALLER-TEC) 10 MG tablet Take 1 tablet every day by oral route.   Yes [provider]  ELDERBERRY PO Take by mouth.   Yes [provider]  escitalopram (LEXAPRO) 10 MG tablet Take 1 tablet by mouth daily. 02/19/24  Yes [provider]  ibuprofen (ADVIL) 600 MG tablet TAKE 1 TABLET(S) BY MOUTH EVERY 4 TO 6 HOURS   Yes [provider]  Magnesium-Zinc (MAGNESIUM-CHELATED ZINC PO) Take 2 capsules in the evening 06/20/23  Yes [provider]   Multiple Vitamins-Minerals (ONE-A-DAY MENS 50+ PO)    Yes [provider]  rosuvastatin (CRESTOR) 10 MG tablet Take 10 mg by mouth at bedtime. 05/15/20  Yes [provider]  tadalafil (CIALIS) 10 MG tablet Take 1 tablet by mouth daily. 07/12/23  Yes [provider]  vitamin B-12 (CYANOCOBALAMIN ) 100 MCG tablet Take 100 mcg by mouth daily.   Yes [provider]  Semaglutide-Weight Management (WEGOVY) 0.25 MG/0.5ML SOAJ Inject 0.25 mg every week by subcutaneous route. Patient not taking: Reported on 04/23/2024 01/31/24   [provider]  triamcinolone cream (KENALOG) 0.5 % APPLY A THIN LAYER TO THE AFFECTED AREA(S) BY TOPICAL ROUTE 2 TIMES PER DAY 01/31/24   [provider]    Current Outpatient Medications  Medication Sig Dispense Refill   atenolol (TENORMIN) 100 MG tablet Take 100 mg by mouth daily.     cetirizine (KLS ALLER-TEC) 10 MG tablet Take 1 tablet every day by oral route.     ELDERBERRY PO Take by mouth.     escitalopram (LEXAPRO) 10 MG tablet Take 1 tablet by mouth daily.     ibuprofen (ADVIL) 600 MG tablet TAKE 1 TABLET(S) BY MOUTH EVERY 4 TO 6 HOURS     Magnesium-Zinc (MAGNESIUM-CHELATED ZINC PO) Take 2 capsules in the evening     Multiple Vitamins-Minerals (ONE-A-DAY MENS 50+ PO)      rosuvastatin (CRESTOR) 10 MG tablet Take 10 mg by mouth at bedtime.     tadalafil (CIALIS) 10 MG tablet Take 1 tablet by mouth  daily.     vitamin B-12 (CYANOCOBALAMIN ) 100 MCG tablet Take 100 mcg by mouth daily.     Semaglutide-Weight Management (WEGOVY) 0.25 MG/0.5ML SOAJ Inject 0.25 mg every week by subcutaneous route. (Patient not taking: Reported on 04/23/2024)     triamcinolone cream (KENALOG) 0.5 % APPLY A THIN LAYER TO THE AFFECTED AREA(S) BY TOPICAL ROUTE 2 TIMES PER DAY     Current Facility-Administered Medications  Medication Dose Route Frequency Provider Last Rate Last Admin   0.9 %  sodium chloride infusion  500 mL Intravenous Continuous  Daina Drum, MD        Allergies as of 04/23/2024   (No Known Allergies)    Family History  Problem Relation Age of Onset   Colon cancer Neg Hx    Colon polyps Neg Hx    Esophageal cancer Neg Hx    Rectal cancer Neg Hx    Stomach cancer Neg Hx     Social History   Socioeconomic History   Marital status: Married    Spouse name: Not on file   Number of children: Not on file   Years of education: Not on file   Highest education level: Not on file  Occupational History   Not on file  Tobacco Use   Smoking status: Never   Smokeless tobacco: Never  Vaping Use   Vaping status: Never Used  Substance and Sexual Activity   Alcohol use: Yes    Comment: occassionally   Drug use: Never   Sexual activity: Not on file  Other Topics Concern   Not on file  Social History Narrative   Not on file   Social Drivers of Health   Financial Resource Strain: Not on file  Food Insecurity: Not on file  Transportation Needs: Not on file  Physical Activity: Not on file  Stress: Not on file  Social Connections: Not on file  Intimate Partner Violence: Not on file    Physical Exam: Vital signs in last 24 hours: BP (!) 140/98   Pulse 71   Temp 98.2 F (36.8 C) (Temporal)   Ht 6\' 4"  (1.93 m)   Wt 243 lb (110.2 kg)   SpO2 95%   BMI 29.58 kg/m  GEN: NAD EYE: Sclerae anicteric ENT: MMM CV: Non-tachycardic Pulm: No increased work of breathing GI: Soft, NT/ND NEURO:  Alert & Oriented   Regino Caprio, MD [facility]  04/23/2024 11:10 AM

## 2024-04-23 NOTE — Patient Instructions (Signed)

## 2024-04-24 ENCOUNTER — Telehealth: Payer: Self-pay | Admitting: *Deleted

## 2024-04-24 NOTE — Telephone Encounter (Signed)
  Follow up Call-     04/23/2024   10:08 AM  Call back number  Post procedure Call Back phone  # [phone]  Permission to leave phone message Yes     Patient questions:  Do you have a fever, pain , or abdominal swelling? No. Pain Score  0 *  Have you tolerated food without any problems? Yes.    Have you been able to return to your normal activities? Yes.    Do you have any questions about your discharge instructions: Diet   No. Medications  No. Follow up visit  No.  Do you have questions or concerns about your Care? No.  Actions: * If pain score is 4 or above: No action needed, pain <4.

## 2024-04-26 ENCOUNTER — Ambulatory Visit: Payer: Self-pay | Admitting: Internal Medicine

## 2024-04-26 LAB — SURGICAL PATHOLOGY

## 2024-07-17 ENCOUNTER — Other Ambulatory Visit: Payer: Self-pay | Admitting: Orthopedic Surgery

## 2024-07-23 ENCOUNTER — Encounter (HOSPITAL_BASED_OUTPATIENT_CLINIC_OR_DEPARTMENT_OTHER): Payer: Self-pay | Admitting: Orthopedic Surgery

## 2024-07-25 ENCOUNTER — Encounter (HOSPITAL_BASED_OUTPATIENT_CLINIC_OR_DEPARTMENT_OTHER)
Admission: RE | Admit: 2024-07-25 | Discharge: 2024-07-25 | Disposition: A | Discharge status: Home / Self Care | Source: Ambulatory Visit | Attending: Orthopedic Surgery | Admitting: Orthopedic Surgery

## 2024-07-25 DIAGNOSIS — Z01810 Encounter for preprocedural cardiovascular examination: Secondary | ICD-10-CM | POA: Diagnosis not present

## 2024-07-25 DIAGNOSIS — Z01818 Encounter for other preprocedural examination: Secondary | ICD-10-CM | POA: Diagnosis present

## 2024-07-25 NOTE — Progress Notes (Signed)

## 2024-07-30 ENCOUNTER — Ambulatory Visit (HOSPITAL_BASED_OUTPATIENT_CLINIC_OR_DEPARTMENT_OTHER)
Admission: RE | Admit: 2024-07-30 | Discharge: 2024-07-30 | Disposition: A | Discharge status: Home / Self Care | Attending: Orthopedic Surgery | Admitting: Orthopedic Surgery

## 2024-07-30 ENCOUNTER — Ambulatory Visit (HOSPITAL_BASED_OUTPATIENT_CLINIC_OR_DEPARTMENT_OTHER): Admitting: Anesthesiology

## 2024-07-30 ENCOUNTER — Encounter (HOSPITAL_BASED_OUTPATIENT_CLINIC_OR_DEPARTMENT_OTHER)
Admission: RE | Disposition: A | Discharge status: Home / Self Care | Payer: Self-pay | Source: Home / Self Care | Attending: Orthopedic Surgery

## 2024-07-30 ENCOUNTER — Encounter (HOSPITAL_BASED_OUTPATIENT_CLINIC_OR_DEPARTMENT_OTHER): Payer: Self-pay | Admitting: Orthopedic Surgery

## 2024-07-30 ENCOUNTER — Other Ambulatory Visit: Payer: Self-pay

## 2024-07-30 DIAGNOSIS — M1990 Unspecified osteoarthritis, unspecified site: Secondary | ICD-10-CM | POA: Diagnosis not present

## 2024-07-30 DIAGNOSIS — S83231A Complex tear of medial meniscus, current injury, right knee, initial encounter: Secondary | ICD-10-CM | POA: Diagnosis present

## 2024-07-30 DIAGNOSIS — E785 Hyperlipidemia, unspecified: Secondary | ICD-10-CM | POA: Insufficient documentation

## 2024-07-30 DIAGNOSIS — Z79899 Other long term (current) drug therapy: Secondary | ICD-10-CM | POA: Diagnosis not present

## 2024-07-30 DIAGNOSIS — I10 Essential (primary) hypertension: Secondary | ICD-10-CM | POA: Insufficient documentation

## 2024-07-30 DIAGNOSIS — X58XXXA Exposure to other specified factors, initial encounter: Secondary | ICD-10-CM | POA: Diagnosis not present

## 2024-07-30 DIAGNOSIS — M65961 Unspecified synovitis and tenosynovitis, right lower leg: Secondary | ICD-10-CM | POA: Insufficient documentation

## 2024-07-30 DIAGNOSIS — Z01818 Encounter for other preprocedural examination: Secondary | ICD-10-CM

## 2024-07-30 HISTORY — PX: KNEE ARTHROSCOPY WITH MEDIAL MENISECTOMY: SHX5651

## 2024-07-30 LAB — NO BLOOD PRODUCTS

## 2024-07-30 SURGERY — ARTHROSCOPY, KNEE, WITH MEDIAL MENISCECTOMY
Anesthesia: General | Site: Knee | Laterality: Right

## 2024-07-30 MED ORDER — LACTATED RINGERS IV SOLN: INTRAVENOUS | Status: DC

## 2024-07-30 MED ORDER — KETOROLAC TROMETHAMINE 30 MG/ML IJ SOLN
INTRAMUSCULAR | Status: DC | PRN
  Administered 2024-07-30: 30 mg via INTRAVENOUS

## 2024-07-30 MED ORDER — PROPOFOL 10 MG/ML IV BOLUS
INTRAVENOUS | Status: DC | PRN
  Administered 2024-07-30: 30 mg via INTRAVENOUS
  Administered 2024-07-30: 50 mg via INTRAVENOUS
  Administered 2024-07-30: 200 mg via INTRAVENOUS

## 2024-07-30 MED ORDER — BUPIVACAINE HCL (PF) 0.5 % IJ SOLN
INTRAMUSCULAR | Status: DC | PRN
  Administered 2024-07-30: 20 mL via INTRA_ARTICULAR

## 2024-07-30 MED ORDER — MIDAZOLAM HCL 5 MG/5ML IJ SOLN
INTRAMUSCULAR | Status: DC | PRN
  Administered 2024-07-30: 2 mg via INTRAVENOUS

## 2024-07-30 MED ORDER — ONDANSETRON HCL 4 MG/2ML IJ SOLN
INTRAMUSCULAR | Status: AC
  Filled 2024-07-30: qty 2

## 2024-07-30 MED ORDER — CEFAZOLIN SODIUM-DEXTROSE 2-4 GM/100ML-% IV SOLN
2.0000 g | INTRAVENOUS | Status: AC
  Administered 2024-07-30: 2 g via INTRAVENOUS

## 2024-07-30 MED ORDER — CEFAZOLIN SODIUM-DEXTROSE 2-4 GM/100ML-% IV SOLN
INTRAVENOUS | Status: AC
  Filled 2024-07-30: qty 100

## 2024-07-30 MED ORDER — ACETAMINOPHEN 500 MG PO TABS
ORAL_TABLET | ORAL | Status: AC
Start: 2024-07-30 — End: 2024-07-30
  Filled 2024-07-30: qty 2

## 2024-07-30 MED ORDER — HYDROMORPHONE HCL 1 MG/ML IJ SOLN
INTRAMUSCULAR | Status: AC
Start: 2024-07-30 — End: 2024-07-30
  Filled 2024-07-30: qty 0.5

## 2024-07-30 MED ORDER — DEXAMETHASONE SODIUM PHOSPHATE 10 MG/ML IJ SOLN
INTRAMUSCULAR | Status: DC | PRN
  Administered 2024-07-30: 10 mg via INTRAVENOUS

## 2024-07-30 MED ORDER — SODIUM CHLORIDE 0.9 % IR SOLN
Status: DC | PRN
Start: 2024-07-30 — End: 2024-07-30
  Administered 2024-07-30: 6000 mL

## 2024-07-30 MED ORDER — PROPOFOL 500 MG/50ML IV EMUL
INTRAVENOUS | Status: AC
  Filled 2024-07-30: qty 50

## 2024-07-30 MED ORDER — LIDOCAINE 2% (20 MG/ML) 5 ML SYRINGE
INTRAMUSCULAR | Status: AC
  Filled 2024-07-30: qty 5

## 2024-07-30 MED ORDER — BUPIVACAINE HCL (PF) 0.25 % IJ SOLN
INTRAMUSCULAR | Status: AC
  Filled 2024-07-30: qty 30

## 2024-07-30 MED ORDER — FENTANYL CITRATE (PF) 100 MCG/2ML IJ SOLN
INTRAMUSCULAR | Status: AC
  Filled 2024-07-30: qty 2

## 2024-07-30 MED ORDER — ONDANSETRON 4 MG PO TBDP: 4.0000 mg | ORAL_TABLET | Freq: Three times a day (TID) | ORAL | 0 refills | Status: AC | PRN

## 2024-07-30 MED ORDER — FENTANYL CITRATE (PF) 100 MCG/2ML IJ SOLN
INTRAMUSCULAR | Status: DC | PRN
  Administered 2024-07-30 (×3): 50 ug via INTRAVENOUS

## 2024-07-30 MED ORDER — OXYCODONE HCL 5 MG PO TABS: 5.0000 mg | ORAL_TABLET | Freq: Four times a day (QID) | ORAL | 0 refills | Status: AC | PRN

## 2024-07-30 MED ORDER — ASPIRIN 81 MG PO TBEC: 81.0000 mg | DELAYED_RELEASE_TABLET | Freq: Two times a day (BID) | ORAL | 0 refills | Status: AC

## 2024-07-30 MED ORDER — HYDROMORPHONE HCL 1 MG/ML IJ SOLN
0.2500 mg | INTRAMUSCULAR | Status: DC | PRN
  Administered 2024-07-30: 0.5 mg via INTRAVENOUS

## 2024-07-30 MED ORDER — DEXAMETHASONE SODIUM PHOSPHATE 10 MG/ML IJ SOLN
INTRAMUSCULAR | Status: AC
Start: 2024-07-30 — End: 2024-07-30
  Filled 2024-07-30: qty 1

## 2024-07-30 MED ORDER — BUPIVACAINE HCL (PF) 0.5 % IJ SOLN
INTRAMUSCULAR | Status: AC
Start: 2024-07-30 — End: 2024-07-30
  Filled 2024-07-30: qty 30

## 2024-07-30 MED ORDER — LIDOCAINE 2% (20 MG/ML) 5 ML SYRINGE
INTRAMUSCULAR | Status: DC | PRN
  Administered 2024-07-30: 60 mg via INTRAVENOUS

## 2024-07-30 MED ORDER — ACETAMINOPHEN 500 MG PO TABS
1000.0000 mg | ORAL_TABLET | Freq: Once | ORAL | Status: AC
  Administered 2024-07-30: 1000 mg via ORAL

## 2024-07-30 MED ORDER — ONDANSETRON HCL 4 MG/2ML IJ SOLN
INTRAMUSCULAR | Status: DC | PRN
  Administered 2024-07-30: 4 mg via INTRAVENOUS

## 2024-07-30 MED ORDER — PROPOFOL 500 MG/50ML IV EMUL
INTRAVENOUS | Status: DC | PRN
Start: 2024-07-30 — End: 2024-07-30
  Administered 2024-07-30: 200 ug/kg/min via INTRAVENOUS

## 2024-07-30 MED ORDER — MIDAZOLAM HCL 2 MG/2ML IJ SOLN
INTRAMUSCULAR | Status: AC
  Filled 2024-07-30: qty 2

## 2024-07-30 SURGICAL SUPPLY — 37 items
BNDG COHESIVE 4X5 TAN STRL LF (GAUZE/BANDAGES/DRESSINGS) IMPLANT
BNDG COMPR ESMARK 6X3 LF (GAUZE/BANDAGES/DRESSINGS) ×1 IMPLANT
BNDG ELASTIC 6INX 5YD STR LF (GAUZE/BANDAGES/DRESSINGS) ×1 IMPLANT
CHLORAPREP W/TINT 26 (MISCELLANEOUS) ×1 IMPLANT
CLSR STERI-STRIP ANTIMIC 1/2X4 (GAUZE/BANDAGES/DRESSINGS) IMPLANT
CUFF TRNQT CYL 34X4.125X (TOURNIQUET CUFF) IMPLANT
DISSECTOR 4.0MM X 13CM (MISCELLANEOUS) ×1 IMPLANT
DRAPE IMP U-DRAPE 54X76 (DRAPES) IMPLANT
DRAPE U-SHAPE 47X51 STRL (DRAPES) ×1 IMPLANT
DRAPE-T ARTHROSCOPY W/POUCH (DRAPES) ×1 IMPLANT
DRSG EMULSION OIL 3X3 NADH (GAUZE/BANDAGES/DRESSINGS) ×1 IMPLANT
GAUZE SPONGE 4X4 12PLY STRL (GAUZE/BANDAGES/DRESSINGS) ×2 IMPLANT
GLOVE BIO SURGEON STRL SZ7.5 (GLOVE) ×1 IMPLANT
GLOVE BIO SURGEON STRL SZ8 (GLOVE) ×1 IMPLANT
GLOVE BIOGEL PI IND STRL 7.0 (GLOVE) IMPLANT
GLOVE BIOGEL PI IND STRL 8 (GLOVE) ×1 IMPLANT
GLOVE SURG SS PI 6.5 STRL IVOR (GLOVE) IMPLANT
GOWN STRL REUS W/ TWL LRG LVL3 (GOWN DISPOSABLE) ×1 IMPLANT
GOWN STRL REUS W/ TWL XL LVL3 (GOWN DISPOSABLE) IMPLANT
GOWN STRL REUS W/TWL XL LVL3 (GOWN DISPOSABLE) ×1 IMPLANT
NDL SUT 2-0 SCORPION KNEE (NEEDLE) IMPLANT
NEEDLE SUT 2-0 SCORPION KNEE (NEEDLE) IMPLANT
NS IRRIG 1000ML POUR BTL (IV SOLUTION) ×1 IMPLANT
PACK ARTHROSCOPY DSU (CUSTOM PROCEDURE TRAY) ×1 IMPLANT
PACK BASIN DAY SURGERY FS (CUSTOM PROCEDURE TRAY) ×1 IMPLANT
PAD CAST 4YDX4 CTTN HI CHSV (CAST SUPPLIES) IMPLANT
SLEEVE SCD COMPRESS KNEE MED (STOCKING) ×1 IMPLANT
SPIKE FLUID TRANSFER (MISCELLANEOUS) IMPLANT
SUT ETHILON 3 0 PS 1 (SUTURE) ×1 IMPLANT
SUT MNCRL AB 4-0 PS2 18 (SUTURE) IMPLANT
TOWEL GREEN STERILE FF (TOWEL DISPOSABLE) ×1 IMPLANT
TUBE CONNECTING 20X1/4 (TUBING) ×1 IMPLANT
TUBING ARTHROSCOPY IRRIG 16FT (MISCELLANEOUS) ×1 IMPLANT
WAND ABLATOR APOLLO I90 (BUR) IMPLANT
WAND APOLLO RF 50D ABLATOR (BUR) ×1 IMPLANT
WAND APOLLORF SJ50 AR-9845 (SURGICAL WAND) IMPLANT
WRAP KNEE MAXI GEL POST OP (GAUZE/BANDAGES/DRESSINGS) ×1 IMPLANT

## 2024-07-30 NOTE — Anesthesia Postprocedure Evaluation (Signed)
 Anesthesia Post Note  Patient: Robert Monroe  Procedure(s) Performed: ARTHROSCOPY, KNEE, WITH PARTIAL MEDIAL MENISCECTOMY (Right: Knee) CHONDROPLASTY, REMOVAL OF LOOSE BODIES (Right: Knee)     Patient location during evaluation: PACU Anesthesia Type: General Level of consciousness: awake and alert and oriented Pain management: pain level controlled Vital Signs Assessment: post-procedure vital signs reviewed and stable Respiratory status: spontaneous breathing, nonlabored ventilation and respiratory function stable Cardiovascular status: blood pressure returned to baseline and stable Postop Assessment: no apparent nausea or vomiting Anesthetic complications: no   No notable events documented.  Last Vitals:  Vitals:   07/30/24 0945 07/30/24 1005  BP:  (!) 128/91  Pulse: (!) 58 (!) 54  Resp: 14 14  Temp:  36.6 C  SpO2: 92% 94%    Last Pain:  Vitals:   07/30/24 1005  TempSrc: Temporal  PainSc: 2                  Shawntez Dickison A.

## 2024-07-30 NOTE — Anesthesia Procedure Notes (Signed)
 Procedure Name: LMA Insertion Date/Time: 07/30/2024 7:43 AM  Performed by: Julieanne Fairy BROCKS, CRNAPre-anesthesia Checklist: Patient identified, Emergency Drugs available, Suction available and Patient being monitored Patient Re-evaluated:Patient Re-evaluated prior to induction Oxygen Delivery Method: Circle system utilized Preoxygenation: Pre-oxygenation with 100% oxygen Induction Type: IV induction Ventilation: Mask ventilation without difficulty LMA: LMA inserted LMA Size: 5.0 Number of attempts: 1 Airway Equipment and Method: Bite block Placement Confirmation: positive ETCO2 Tube secured with: Tape Dental Injury: Teeth and Oropharynx as per pre-operative assessment

## 2024-07-30 NOTE — Transfer of Care (Signed)
 Immediate Anesthesia Transfer of Care Note  Patient: Robert Monroe  Procedure(s) Performed: ARTHROSCOPY, KNEE, WITH PARTIAL MEDIAL MENISCECTOMY (Right: Knee) CHONDROPLASTY, REMOVAL OF LOOSE BODIES (Right: Knee)  Patient Location: PACU  Anesthesia Type:General  Level of Consciousness: sedated  Airway & Oxygen Therapy: Patient Spontanous Breathing and Patient connected to face mask oxygen  Post-op Assessment: Report given to RN and Post -op Vital signs reviewed and stable  Post vital signs: Reviewed and stable  Last Vitals:  Vitals Value Taken Time  BP 104/65 07/30/24 09:00  Temp    Pulse 56 07/30/24 09:02  Resp 12 07/30/24 09:02  SpO2 94 % 07/30/24 09:02  Vitals shown include unfiled device data.  Last Pain:  Vitals:   07/30/24 0646  TempSrc: Temporal  PainSc: 2          Complications: No notable events documented.

## 2024-07-30 NOTE — Op Note (Signed)
 .Orthopaedic Surgery Operative Note (CSN: [id number])  Robert Monroe  1971-04-03 Date of Surgery: 07/30/2024   Diagnoses:  Right knee medial meniscus tear of right knee  Procedure: Right knee arthroscopy with partial medial meniscectomy and chondroplasty  Post-operative plan: The patient will be weight bearing as tolerated.  The patient will be started in physcial therapy in 3-5 days.  DVT prophylaxis Aspirin  81 mg twice daily for 2 weeks.   Pain control with PRN pain medication preferring oral medicines.  Follow up plan will be scheduled in approximately 10-14 days for incision check.  Post-Op Diagnosis: Same Surgeons:Primary: Sherida Adine BROCKS, MD Assistants:none Location: [facility] OR ROOM 5 Anesthesia: General Antibiotics: Ancef  2 g Tourniquet time:  Total Tourniquet Time Documented: Thigh (Right) - 47 minutes Total: Thigh (Right) - 47 minutes  Estimated Blood Loss: Minimal Complications: None Specimens: None Implants: * No implants in log *  Indications for Surgery:   Robert Monroe is a 53 y.o. male with Persistent right knee pain.  His MRI demonstrates a medial meniscus tear as well as multiple loose bodies in the posterior aspect of his knee.  He had no significant long-term improvement with anti-inflammatory medications, activity modification, physical therapy and a corticosteroid injection.  He is indicated for a right knee arthroscopy with partial medial meniscectomy, chondroplasty and removal of loose body.  Risks and benefits of surgery were discussed with patient and all questions answered. These risks include but are not limtied to infection, bleeding, damage to surrounding neurovascular structures, stiffness, DVT, prolonged rehabilitation, continued pain or impaired function, need for additional treatment/surgery and cardiopulmonary complications from anesthesia. The patient also understands that his prognosis is very dependent on the amount of arthritis seen at the  time of surgery. The patient understands all of this and wishes to proceed with surgery.   Exam under anesthesia: Range of motion full and symmetric to opposite knee, ligamentously stable exam with normal Lachman  Arthroscopic findings: Suprapatellar pouch:There was no loose bodies but extensive synovitis. There was diffuse grade 2 changes to the chondral surface of the patella and central trochlea with the central ridge and lateral facet of the patella with some areas of grade 3-4 chondral change  Medial compartment:There was a complex degenerative tear of the medial meniscus posterior horn and body. There was grade 2 chondral changes with loose flaps of the medial femoral condyle medial tibial plateau.  Lateral Compartment: The lateral meniscus was intact.  The chondral surface of the lateral femoral condyle and lateral tibial plateau were intact.  Intercondylar Notch: The ACL showed some mucoid degeneration but was intact.  The PCL was intact.  Posterior compartment: There were no visible loose bodies.  Procedure:   The patient was identified properly. Informed consent was obtained and the surgical site was marked. The patient was taken up to suite where general anesthesia was induced. The patient was placed in the supine position with an arthroscopic leg holder and a nonsterile tourniquet applied. The surgical leg was then prepped and draped usual sterile fashion.  A standard surgical timeout was performed.  2 standard anterior portals were made and diagnostic arthroscopy performed. Please note the findings as noted above.  We used a shaver to perform a synovectomy of the anterior medial and anterolateral compartments as well as overgrowth along the patella.  We performed a partial medial meniscectomy using a shaver and basket back to a stable base removing all loose fragments. A gentle chondroplasty was performed of the medial femoral condyle and medial  tibial plateau using a shaver and RF  ablator.  The instruments were then moved to the suprapatellar pouch.  Again a gentle chondroplasty was performed of the patella and trochlea using a shaver and RF ablator.  The arthroscope was placed into the posterior knee compartment using a Gilquist maneuver placing the scope between the medial femoral condyle and PCL. Using a spinal needle, a posterior medial portal was made.  A shaver was introduced into the posterior aspect of the knee..  A small synovectomy was performed with care taken not to disrupt the neurovascular bundle.  No obvious loose bodies were encountered.  Excess arthroscopic fluid was removed. Incisions closed with Nylon suture. The knee was infiltrated with 20 cc of 0.5% Marcaine . The patient was awoken from general anesthesia and taken to the PACU in stable condition without complication.

## 2024-07-30 NOTE — Discharge Instructions (Addendum)
 Adine Mon, M.D. [facility] [address] [place], [place] [address] [phone]   POST-OPERATIVE INSTRUCTIONS - Knee Arthroscopy  WOUND CARE - You may remove the Operative Dressing on Post-Op Day #3 (72hrs after surgery).   -  Alternatively if you would like you can leave dressing on until follow-up if within 7-8 days but keep it dry. - Leave steri-strips in place until they fall off on their own, usually 2 weeks postop. - An ACE wrap may be used to control swelling, do not wrap this too tight.  If the initial ACE wrap feels too tight you may loosen it. - There may be a small amount of fluid/bleeding leaking at the surgical site.  - This is normal; the knee is filled with fluid during the procedure and can leak for 24-48hrs after surgery. You may change/reinforce the bandage as needed.  - Use the Cryocuff or Ice as often as possible for the first 7 days, then as needed for pain relief. Always keep a towel, ACE wrap or other barrier between the cooling unit and your skin.  - You may shower on Post-Op Day #3. Gently pat the area dry.  - Do not soak the knee in water or submerge it.  - Do not go swimming in the pool or ocean until 4 weeks after surgery or when otherwise instructed.  Keep dry incisions as dry as possible.   BRACE/AMBULATION  -           You will not need a brace after this procedure.   - You may use crutches initially to help you weight bear, but this is not required - You can put full weight on your operative leg as you feel comfortable  PHYSICAL THERAPY - You will begin physical therapy soon after surgery (unless otherwise specified) - Please call to set up an appointment, if you do not already have one  - Let our office if there are any issues with scheduling your therapy   REGIONAL ANESTHESIA (NERVE BLOCKS) The anesthesia team may have performed a nerve block for you this is a great tool used to minimize pain.   The block may  start wearing off overnight (between 8-24 hours postop) When the block wears off, your pain may go from nearly zero to the pain you would have had postop without the block. This is an abrupt transition but nothing dangerous is happening.   This can be a challenging period but utilize your as needed pain medications to try and manage this period. We suggest you use the pain medication the first night prior to going to bed, to ease this transition.  You may take an extra dose of narcotic when this happens if needed   POST-OP MEDICATIONS- Multimodal approach to pain control In general your pain will be controlled with a combination of substances.  Prescriptions unless otherwise discussed are electronically sent to your pharmacy.  This is a carefully made plan we use to minimize narcotic use.     Ibuprofen or Naproxen - Anti-inflammatory medication taken on a scheduled basis 2-3 times a day for 1-2 weeks Acetaminophen  - Non-narcotic pain medicine taken on a scheduled basis 2-3 times a day for 1-2 weeks Oxycodone - This is a strong narcotic, to be used only on an "as needed" basis for SEVERE pain. Aspirin  81mg  - This medicine is used to minimize the risk of blood clots after surgery. Zofran  - take as needed for nausea   FOLLOW-UP  Please call the office to schedule a follow-up appointment for your incision check, 7-10 days post-operatively.   IF YOU HAVE ANY QUESTIONS, PLEASE FEEL FREE TO CALL OUR OFFICE.   HELPFUL INFORMATION   Keep your leg elevated to decrease swelling, which will then in turn decrease your pain. I would elevate the foot of your bed by putting a couple of couch pillows between your mattress and box spring. I would not keep pillow directly under your ankle.  - Do not sleep with a pillow behind your knee even if it is more comfortable as this may make it harder to get your knee fully straight long term.   There will be MORE swelling on days 1-3 than there is on the day  of surgery.  This also is normal. The swelling will decrease with the anti-inflammatory medication, ice and keeping it elevated. The swelling will make it more difficult to bend your knee. As the swelling goes down your motion will become easier   You may develop swelling and bruising that extends from your knee down to your calf and perhaps even to your foot over the next week. Do not be alarmed. This too is normal, and it is due to gravity   There may be some numbness adjacent to the incision site. This may last for 6-12 months or longer in some patients and is expected.   You may return to sedentary work/school in the next couple of days when you feel up to it. You will need to keep your leg elevated as much as possible    You should wean off your narcotic medicines as soon as you are able.  Most patients will be off narcotics before their first postop appointment.    We suggest you use the pain medication the first night prior to going to bed, in order to ease any pain when the anesthesia wears off. You should avoid taking pain medications on an empty stomach as it will make you nauseous.   Do not drink alcoholic beverages or take illicit drugs when taking pain medications.   It is against the law to drive while taking narcotics. You cannot drive if your Right leg is in brace locked in extension.   Pain medication may make you constipated.  Below are a few solutions to try in this order:  o Decrease the amount of pain medication if you aren't having pain.  o Drink lots of decaffeinated fluids.  o Drink prune juice and/or eat dried prunes   o If the first 3 don't work start with additional solutions  o Take Colace - an over-the-counter stool softener  o Take Senokot - an over-the-counter laxative  o Take Miralax - a stronger over-the-counter laxative   Post Anesthesia Home Care Instructions  Activity: Get plenty of rest for the remainder of the day. A responsible individual  must stay with you for 24 hours following the procedure.  For the next 24 hours, DO NOT: -Drive a car -Advertising copywriter -Drink alcoholic beverages -Take any medication unless instructed by your physician -Make any legal decisions or sign important papers.  Meals: Start with liquid foods such as gelatin or soup. Progress to regular foods as tolerated. Avoid greasy, spicy, heavy foods. If nausea and/or vomiting occur, drink only clear liquids until the nausea and/or vomiting subsides. Call your physician if vomiting continues.  Special Instructions/Symptoms: Your throat may feel dry or sore from the anesthesia or the breathing tube placed in your throat during surgery.  If this causes discomfort, gargle with warm salt water. The discomfort should disappear within 24 hours.  Next dose of tylenol  if needed will be after 12pm

## 2024-07-30 NOTE — Anesthesia Preprocedure Evaluation (Addendum)
 Anesthesia Evaluation  Patient identified by MRN, date of birth, ID band Patient awake    Reviewed: Allergy & Precautions, H&P , NPO status , Patient's Chart, lab work & pertinent test results, reviewed documented beta blocker date and time   History of Anesthesia Complications (+) PONV and history of anesthetic complications  Airway Mallampati: II  TM Distance: >3 FB Neck ROM: Full    Dental no notable dental hx. (+) Teeth Intact, Dental Advisory Given   Pulmonary neg pulmonary ROS   Pulmonary exam normal breath sounds clear to auscultation       Cardiovascular hypertension, Pt. on medications and Pt. on home beta blockers  Rhythm:Regular Rate:Normal     Neuro/Psych negative neurological ROS  negative psych ROS   GI/Hepatic negative GI ROS, Neg liver ROS,,,  Endo/Other  negative endocrine ROS    Renal/GU negative Renal ROS  negative genitourinary   Musculoskeletal  (+) Arthritis , Osteoarthritis,    Abdominal   Peds  Hematology negative hematology ROS (+)   Anesthesia Other Findings   Reproductive/Obstetrics negative OB ROS                              Anesthesia Physical Anesthesia Plan  ASA: 2  Anesthesia Plan: General   Post-op Pain Management: Tylenol  PO (pre-op)* and Toradol  IV (intra-op)*   Induction: Intravenous  PONV Risk Score and Plan: 4 or greater and Ondansetron , Dexamethasone , Midazolam , Propofol  infusion and TIVA  Airway Management Planned: LMA  Additional Equipment:   Intra-op Plan:   Post-operative Plan: Extubation in OR  Informed Consent: I have reviewed the patients History and Physical, chart, labs and discussed the procedure including the risks, benefits and alternatives for the proposed anesthesia with the patient or authorized representative who has indicated his/her understanding and acceptance.     Dental advisory given  Plan Discussed with:  CRNA  Anesthesia Plan Comments:          Anesthesia Quick Evaluation

## 2024-07-30 NOTE — H&P (Addendum)
 Chief Complaint: left knee pain HPI: Robert Monroe is an 53 y.o. male who has had persistent left knee pain for several months. He has not had long term improvement following anti-inflammatory medications, activity modifications, physical therapy and a corticosteroid injection to the knee. His MRI demonstrates a medial meniscus tear and multiple loose bodies in the knee.  Past Medical History:  Diagnosis Date   Arthritis    Hyperlipidemia    Hypertension    PONV (postoperative nausea and vomiting)     Past Surgical History:  Procedure Laterality Date   LYMPH NODE BIOPSY Right 2006   ORIF WRIST FRACTURE Left 07/20/2020   Procedure: OPEN REDUCTION INTERNAL FIXATION (ORIF) WRIST FRACTURE;  Surgeon: Shari Easter, MD;  Location: [facility];  Service: Orthopedics;  Laterality: Left;  IV sedation block in preop    Family History  Problem Relation Age of Onset   Colon cancer Neg Hx    Colon polyps Neg Hx    Esophageal cancer Neg Hx    Rectal cancer Neg Hx    Stomach cancer Neg Hx    Social History:  reports that he has never smoked. He has never used smokeless tobacco. He reports current alcohol use. He reports that he does not use drugs.  Allergies: No Known Allergies  Medications Prior to Admission  Medication Sig Dispense Refill   atenolol (TENORMIN) 100 MG tablet Take 100 mg by mouth daily.     ELDERBERRY PO Take by mouth.     escitalopram (LEXAPRO) 10 MG tablet Take 1 tablet by mouth daily.     Magnesium-Zinc (MAGNESIUM-CHELATED ZINC PO) Take 2 capsules in the evening     Multiple Vitamins-Minerals (ONE-A-DAY MENS 50+ PO)      rosuvastatin (CRESTOR) 10 MG tablet Take 10 mg by mouth at bedtime.     vitamin B-12 (CYANOCOBALAMIN ) 100 MCG tablet Take 100 mcg by mouth daily.     cetirizine (KLS ALLER-TEC) 10 MG tablet Take 1 tablet every day by oral route.     ibuprofen (ADVIL) 600 MG tablet TAKE 1 TABLET(S) BY MOUTH EVERY 4 TO 6 HOURS     Semaglutide-Weight  Management (WEGOVY) 0.25 MG/0.5ML SOAJ Inject 0.25 mg every week by subcutaneous route. (Patient not taking: Reported on 04/02/2024)     tadalafil (CIALIS) 10 MG tablet Take 1 tablet by mouth daily.     triamcinolone cream (KENALOG) 0.5 % APPLY A THIN LAYER TO THE AFFECTED AREA(S) BY TOPICAL ROUTE 2 TIMES PER DAY      No results found for this or any previous visit (from the past 48 hours). No results found.  Review of Systems  Constitutional: Negative.   HENT: Negative.    Respiratory: Negative.    Cardiovascular: Negative.   Gastrointestinal: Negative.   Genitourinary: Negative.   Musculoskeletal:  Positive for arthralgias and joint swelling.  Neurological: Negative.   Hematological: Negative.   Psychiatric/Behavioral: Negative.      Height 6' 3 (1.905 m), weight 111.1 kg. Physical Exam Constitutional:      Appearance: Normal appearance.  HENT:     Head: Normocephalic and atraumatic.     Mouth/Throat:     Mouth: Mucous membranes are moist.  Eyes:     Extraocular Movements: Extraocular movements intact.     Pupils: Pupils are equal, round, and reactive to light.  Cardiovascular:     Rate and Rhythm: Normal rate and regular rhythm.     Pulses: Normal pulses.     Heart sounds:  Normal heart sounds.  Pulmonary:     Effort: Pulmonary effort is normal.     Breath sounds: Normal breath sounds.  Abdominal:     General: Abdomen is flat. Bowel sounds are normal.     Palpations: Abdomen is soft.  Musculoskeletal:     Comments: Left knee Skin intact Mild effusion ROM 0-120 Crepitations throught ROM TTP lateral joint line and posterior fossa + McMurray test laterally NVI  Skin:    Capillary Refill: Capillary refill takes less than 2 seconds.  Neurological:     General: No focal deficit present.     Mental Status: He is alert and oriented to person, place, and time.  Psychiatric:        Mood and Affect: Mood normal.        Behavior: Behavior normal.       Assessment/Plan 53 year old male with a left knee medial meniscus tear and multiple loose bodies.  Plan for left knee arthroscopy with partial medial meniscectomy and removal of loose bodies  Risks and benefits of surgery were discussed with patient and all questions answered. These risks include but are not limtied to infection, bleeding, damage to surrounding neurovascular structures, stiffness, DVT, prolonged rehabilitation, continued pain or impaired function, need for additional treatment/surgery and cardiopulmonary complications from anesthesia. The patient also understands that his prognosis is very dependent on the amount of arthritis seen at the time of surgery. The patient understands all of this and wishes to proceed with surgery.  Robert Monroe Mon, MD 07/30/2024, 6:29 AM

## 2024-07-31 ENCOUNTER — Encounter (HOSPITAL_BASED_OUTPATIENT_CLINIC_OR_DEPARTMENT_OTHER): Payer: Self-pay | Admitting: Orthopedic Surgery
